# Patient Record
Sex: Female | Born: 1975 | Race: Black or African American | Hispanic: No | Marital: Single | Smoking: Never smoker
Health system: Southern US, Community
[De-identification: ages and names within clinical notes are randomized; demographics above are authoritative.]

## PROBLEM LIST (undated history)

## (undated) HISTORY — PX: ABDOMINAL HYSTERECTOMY: SHX81

---

## 2015-08-28 ENCOUNTER — Emergency Department (HOSPITAL_COMMUNITY): Payer: BLUE CROSS/BLUE SHIELD

## 2015-08-28 ENCOUNTER — Encounter (HOSPITAL_COMMUNITY): Payer: Self-pay | Admitting: *Deleted

## 2015-08-28 ENCOUNTER — Emergency Department (HOSPITAL_COMMUNITY)
Admission: EM | Admit: 2015-08-28 | Discharge: 2015-08-28 | Disposition: A | Discharge status: Home / Self Care | Payer: BLUE CROSS/BLUE SHIELD | Attending: Emergency Medicine | Admitting: Emergency Medicine

## 2015-08-28 DIAGNOSIS — R110 Nausea: Secondary | ICD-10-CM | POA: Insufficient documentation

## 2015-08-28 DIAGNOSIS — R0789 Other chest pain: Secondary | ICD-10-CM | POA: Diagnosis not present

## 2015-08-28 DIAGNOSIS — R0602 Shortness of breath: Secondary | ICD-10-CM | POA: Insufficient documentation

## 2015-08-28 DIAGNOSIS — R079 Chest pain, unspecified: Secondary | ICD-10-CM | POA: Diagnosis present

## 2015-08-28 LAB — BASIC METABOLIC PANEL
Anion gap: 11 (ref 5–15)
BUN: 11 mg/dL (ref 6–20)
CALCIUM: 9.5 mg/dL (ref 8.9–10.3)
CO2: 25 mmol/L (ref 22–32)
CREATININE: 0.82 mg/dL (ref 0.44–1.00)
Chloride: 105 mmol/L (ref 101–111)
GFR calc Af Amer: >60 mL/min (ref >60)
GFR calc non Af Amer: >60 mL/min (ref >60)
GLUCOSE: 88 mg/dL (ref 65–99)
Potassium: 3.8 mmol/L (ref 3.5–5.1)
Sodium: 141 mmol/L (ref 135–145)

## 2015-08-28 LAB — CBC
HCT: 36.1 % (ref 36.0–46.0)
HEMOGLOBIN: 11.5 g/dL — AB (ref 12.0–15.0)
MCH: 27.4 pg (ref 26.0–34.0)
MCHC: 31.9 g/dL (ref 30.0–36.0)
MCV: 86.2 fL (ref 78.0–100.0)
Platelets: 311 10*3/uL (ref 150–400)
RBC: 4.19 MIL/uL (ref 3.87–5.11)
RDW: 14.2 % (ref 11.5–15.5)
WBC: 6.6 10*3/uL (ref 4.0–10.5)

## 2015-08-28 LAB — I-STAT TROPONIN, ED: TROPONIN I, POC: 0 ng/mL (ref 0.00–0.08)

## 2015-08-28 MED ORDER — NAPROXEN 500 MG PO TABS: 500.0000 mg | ORAL_TABLET | Freq: Two times a day (BID) | ORAL | Status: AC

## 2015-08-28 MED ORDER — TRAMADOL HCL 50 MG PO TABS: 50.0000 mg | ORAL_TABLET | Freq: Four times a day (QID) | ORAL | Status: AC | PRN

## 2015-08-28 MED ORDER — IOPAMIDOL (ISOVUE-370) INJECTION 76%
INTRAVENOUS | Status: AC
  Administered 2015-08-28: 100 mL
  Filled 2015-08-28: qty 100

## 2015-08-28 MED ORDER — KETOROLAC TROMETHAMINE 30 MG/ML IJ SOLN
30.0000 mg | Freq: Once | INTRAMUSCULAR | Status: AC
  Administered 2015-08-28: 30 mg via INTRAVENOUS
  Filled 2015-08-28: qty 1

## 2015-08-28 NOTE — ED Notes (Signed)
Attempted IV x1.

## 2015-08-28 NOTE — ED Notes (Addendum)
Pt reports mid chest pains since Saturday. Pain increases with movement and palpations. Now also having sob, reports n/v for past two days. ekg done at triage and no acute distress noted

## 2015-08-28 NOTE — ED Notes (Addendum)
IV attempted x2

## 2015-08-28 NOTE — Discharge Instructions (Signed)
Take naproxen as directed for the next 2 days, then as needed for pain. Use Ultram only as needed for breakthrough pain. Return to ER for new or worsening symptoms, any additional concerns.

## 2015-08-28 NOTE — ED Provider Notes (Signed)
CSN: [id number]     Arrival date & time 08/28/15  1345 History   First MD Initiated Contact with Patient 08/28/15 1605     Chief Complaint  Patient presents with  . Chest Pain  . Shortness of Breath    (Consider location/radiation/quality/duration/timing/severity/associated sxs/prior Treatment) Patient is a 40 y.o. female presenting with chest pain and shortness of breath. The history is provided by the patient and medical records. No language interpreter was used.  Chest Pain Associated symptoms: nausea and shortness of breath   Associated symptoms: no abdominal pain, no cough, no dizziness, no fever, no headache, no palpitations, not vomiting and no weakness   Shortness of Breath Associated symptoms: chest pain   Associated symptoms: no abdominal pain, no cough, no fever, no headaches, no rash, no sore throat, no vomiting and no wheezing    Morgan Pennington is a 40 y.o. female  who presents to the Emergency Department complaining of sharp, central chest pain that began Saturday, but acutely worsened today. Patient is from [place] and drove down to [place] with fiance to visit family in the area on Saturday when symptoms began. Patient states chest pain is worse with palpation and movement. The first 2-3 days, pain was intermittent, however now pain is constant and worse with exertion. Associated symptoms include nausea and shortness of breath which began today. Ibuprofen taken with little relief. Patient had bronchitis approximately two weeks ago, stating she believes symptoms have improved. Of note, patient is on hormone replacement therapy.   Cardiac risk factors: Denies HTN, HLD, heart disease, not a smoker, no cardiac family history.   History reviewed. No pertinent past medical history. Past Surgical History  Procedure Laterality Date  . Abdominal hysterectomy     History reviewed. No pertinent family history. Social History  Substance Use Topics  . Smoking status: Never Smoker    . Smokeless tobacco: None  . Alcohol Use: Yes     Comment: occ   OB History    No data available     Review of Systems  Constitutional: Negative for fever and chills.  HENT: Negative for congestion and sore throat.   Eyes: Negative for visual disturbance.  Respiratory: Positive for shortness of breath. Negative for cough and wheezing.   Cardiovascular: Positive for chest pain. Negative for palpitations and leg swelling.  Gastrointestinal: Positive for nausea. Negative for vomiting and abdominal pain.  Genitourinary: Negative for dysuria.  Musculoskeletal: Negative for myalgias and arthralgias.  Skin: Negative for rash.  Neurological: Negative for dizziness, weakness and headaches.      Allergies  Dilaudid and Percocet  Home Medications   Prior to Admission medications   Medication Sig Start Date End Date Taking? Authorizing Provider  naproxen (NAPROSYN) 500 MG tablet Take 1 tablet (500 mg total) by mouth 2 (two) times daily. 08/28/15   Chase Picket Ward, PA-C  traMADol (ULTRAM) 50 MG tablet Take 1 tablet (50 mg total) by mouth every 6 (six) hours as needed. 08/28/15   Jaime Pilcher Ward, PA-C   BP 118/60 mmHg  Pulse 55  Temp(Src) 98.2 F (36.8 C) (Oral)  Resp 16  SpO2 100% Physical Exam  Constitutional: She is oriented to person, place, and time. She appears well-developed and well-nourished.  Appears anxious, NAD.  HENT:  Head: Normocephalic and atraumatic.  Mouth/Throat: Oropharynx is clear and moist.  Airway patent.   Cardiovascular: Normal rate, regular rhythm, normal heart sounds and intact distal pulses.  Exam reveals no gallop and no friction rub.  No murmur heard. Pulmonary/Chest: Breath sounds normal. No respiratory distress. She has no wheezes. She has no rales.    100% O2 on RA. Difficulty taking deep breaths.   Abdominal: Soft. Bowel sounds are normal. She exhibits no distension and no mass. There is no tenderness. There is no rebound and no  guarding.  Musculoskeletal: She exhibits no edema.  No calf tenderness, negative homan's bilaterally.   Neurological: She is alert and oriented to person, place, and time.  Skin: Skin is warm and dry.  Nursing note and vitals reviewed.   ED Course  Procedures (including critical care time) Labs Review Labs Reviewed  CBC - Abnormal; Notable for the following:    Hemoglobin 11.5 (*)    All other components within normal limits  BASIC METABOLIC PANEL  I-STAT TROPOININ, ED    Imaging Review Dg Chest 2 View  08/28/2015  CLINICAL DATA:  Acute chest pain and shortness of breath. EXAM: CHEST  2 VIEW COMPARISON:  None. FINDINGS: The heart size and mediastinal contours are within normal limits. Both lungs are clear. No pneumothorax or pleural effusion is noted. The visualized skeletal structures are unremarkable. IMPRESSION: No active cardiopulmonary disease. Electronically Signed   By: Lupita Raider, M.D.   On: 08/28/2015 14:30   Ct Angio Chest Pe W/cm &/or Wo Cm  08/28/2015  CLINICAL DATA:  Acute onset of mid chest pain and palpitations. Shortness of breath. Nausea and vomiting. Initial encounter. EXAM: CT ANGIOGRAPHY CHEST WITH CONTRAST TECHNIQUE: Multidetector CT imaging of the chest was performed using the standard protocol during bolus administration of intravenous contrast. Multiplanar CT image reconstructions and MIPs were obtained to evaluate the vascular anatomy. CONTRAST:  80 mL of Isovue 370 IV contrast COMPARISON:  Chest radiograph performed earlier today at 2:04 p.m. FINDINGS: There is no evidence of pulmonary embolus. Minimal bibasilar atelectasis is noted. The lungs are otherwise clear. There is no evidence of significant focal consolidation, pleural effusion or pneumothorax. No masses are identified; no abnormal focal contrast enhancement is seen. The mediastinum is unremarkable appearance. No mediastinal lymphadenopathy is seen. No pericardial effusion is identified. The great  vessels are grossly unremarkable in appearance. No axillary lymphadenopathy is seen. The visualized portions of the thyroid gland are unremarkable in appearance. Bilateral breast implants are grossly unremarkable in appearance. The visualized portions of the liver and spleen are unremarkable. There is mild reflux of contrast into the hepatic veins and IVC. The visualized portions of the pancreas, gallbladder, stomach, adrenal glands and kidneys are within normal limits. No acute osseous abnormalities are seen. Review of the MIP images confirms the above findings. IMPRESSION: 1. No evidence of pulmonary embolus. 2. Minimal bibasilar atelectasis noted.  Lungs otherwise clear. 3. Mild reflux of contrast noted into the hepatic veins and IVC. Electronically Signed   By: Roanna Raider M.D.   On: 08/28/2015 19:01   I have personally reviewed and evaluated these images and lab results as part of my medical decision-making.   EKG Interpretation   Date/Time:  Wednesday August 28 2015 16:27:32 EDT Ventricular Rate:  54 PR Interval:  165 QRS Duration: 83 QT Interval:  453 QTC Calculation: 429 R Axis:   84 Text Interpretation:  Sinus rhythm Low voltage, precordial leads Confirmed  by MESNER MD, Barbara Cower [phone]) on 08/28/2015 5:41:36 PM      MDM   Final diagnoses:  Atypical chest pain   Morgan Pennington presents with central chest pain associated with shortness of breath that first started on Saturday,  however acutely worsened today. Patient admits to recent bronchitis/cough 2 weeks ago. On exam, patient with labored breathing and tenderness to palpation of central chest. Afebrile and vital signs are stable. Patient has no cardiac risk factors, however [place] and is on hormone replacement, making her at high risk for PE.  Labs: CBC, BMP, and troponin reviewed and reassuring. Imaging: Chest x-ray with no acute cardiopulmonary disease. CT and she has with no evidence of  PE. EKG reviewed.  Therapeutics: Toradol   7:24 PM - Patient reevaluated after Toradol and feels much improved. Breathing unlabored.   A&P: Workup negative for PE and chest pain unlikely to be of CAD etiology. Likely secondary to costochondritis. Will treat with naproxen and give short course of ultram for pain. PCP follow up encouraged. Return precautions given. Patient agrees with plan and all questions were answered.   Patient seen by and discussed with Dr. Clayborne DanaMesner who agrees with treatment plan.     Rand Surgical Pavilion CorpJaime Pilcher Ward, PA-C 08/28/15 1954  Marily MemosJason Mesner, MD 08/31/15 1240

## 2015-08-28 NOTE — ED Notes (Signed)
MD at bedside. 

## 2017-03-26 IMAGING — CT CT ANGIO CHEST
2 of 7 series · 18 of 36 positions shown · IV contrast (Omni 300)
Comparison: Chest radiograph performed earlier today at [DATE] p.m.

CLINICAL DATA: Acute onset of mid chest pain and palpitations.
Shortness of breath. Nausea and vomiting. Initial encounter.

EXAM:
CT ANGIOGRAPHY CHEST WITH CONTRAST
TECHNIQUE: Multidetector CT imaging of the chest was performed using the
standard protocol during bolus administration of intravenous
contrast. Multiplanar CT image reconstructions and MIPs were
obtained to evaluate the vascular anatomy.
CONTRAST:  80 mL of Isovue 370 IV contrast

[Series 6: pe thins · axial · 0.62mm/px · z∈[+1446,+1707]mm · 17 of 589 slices shown]
[im 33/589  lung]
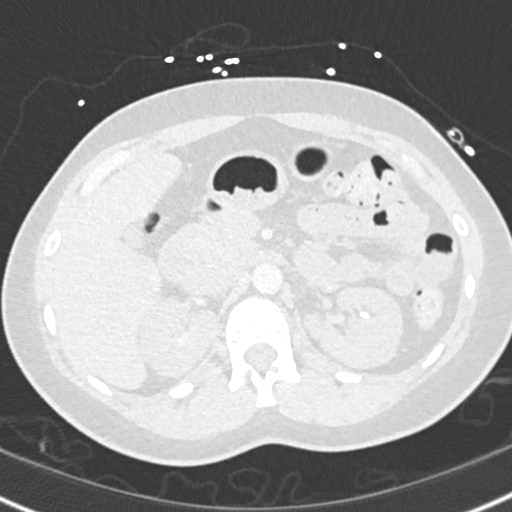
[im 66/589  mediastinal]
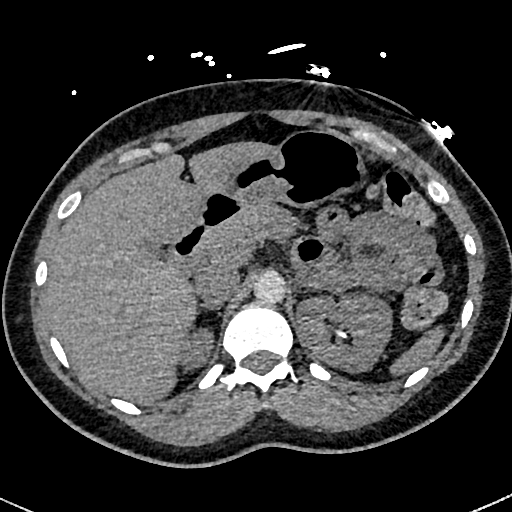
[im 99/589  lung]
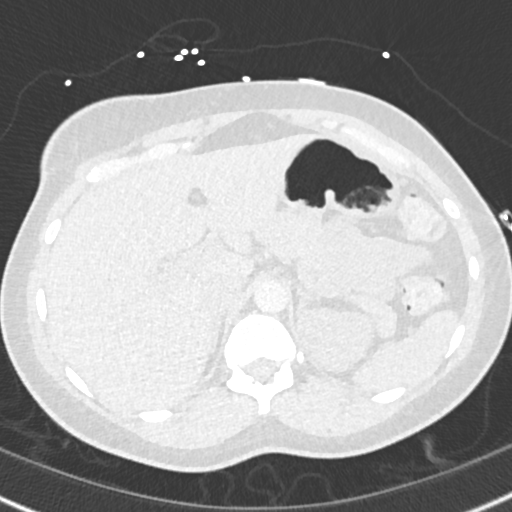
[im 131/589  mediastinal]
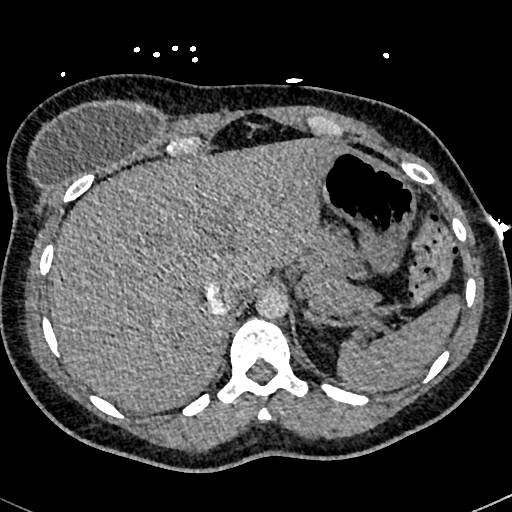
[im 164/589  lung]
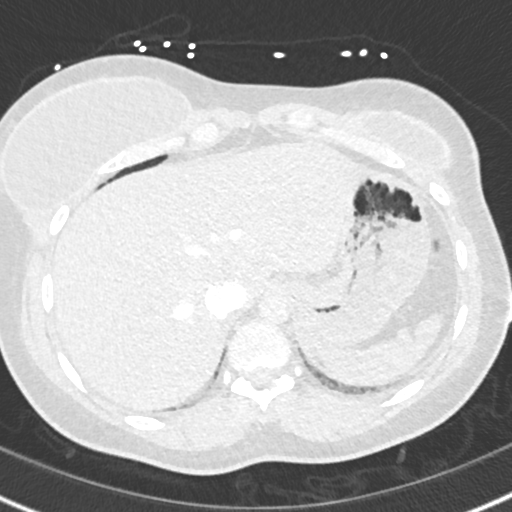
[im 197/589  mediastinal]
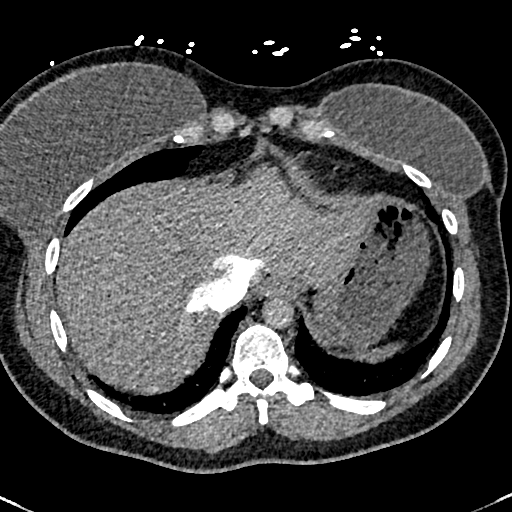
[im 229/589  lung]
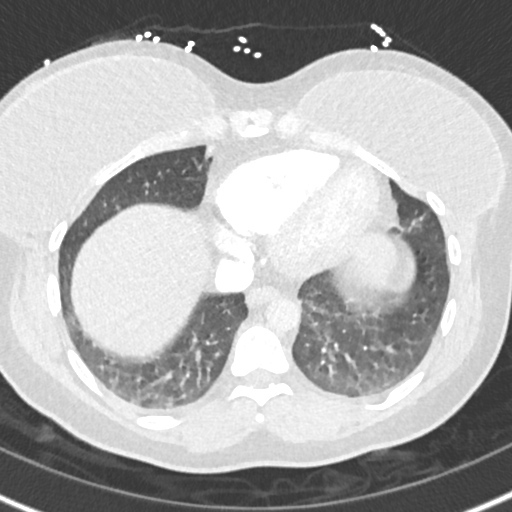
[im 262/589  mediastinal]
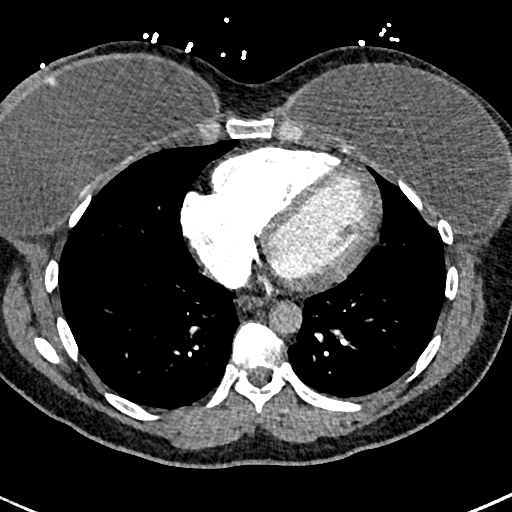
[im 295/589  lung]
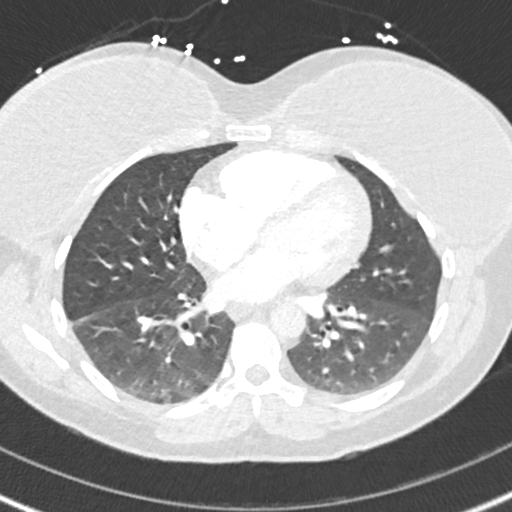
[im 327/589  mediastinal]
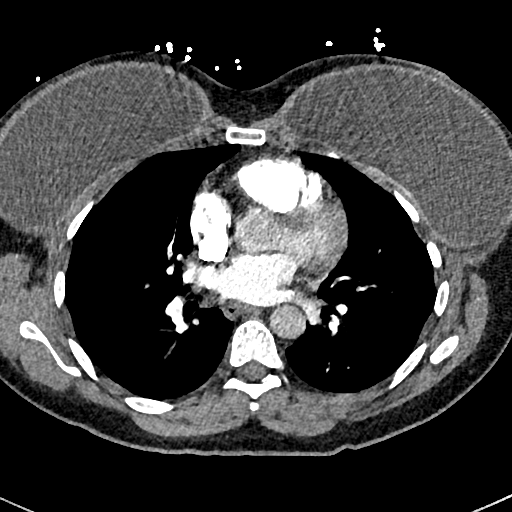
[im 360/589  lung]
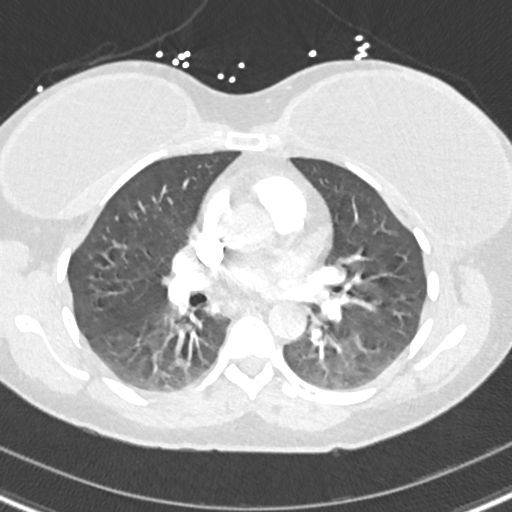
[im 393/589  mediastinal]
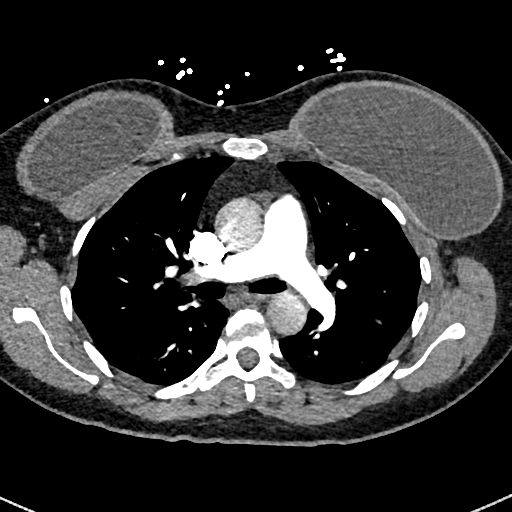
[im 425/589  lung]
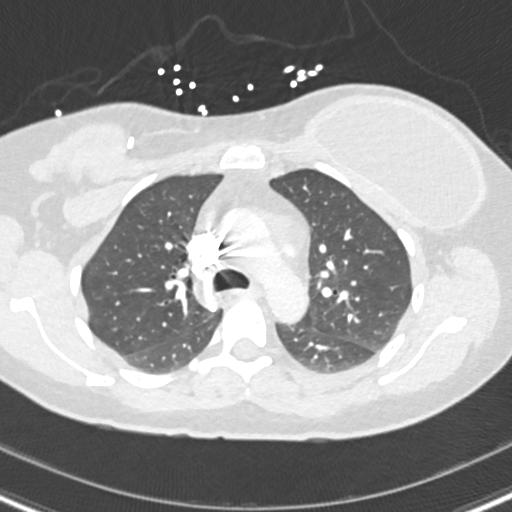
[im 458/589  mediastinal]
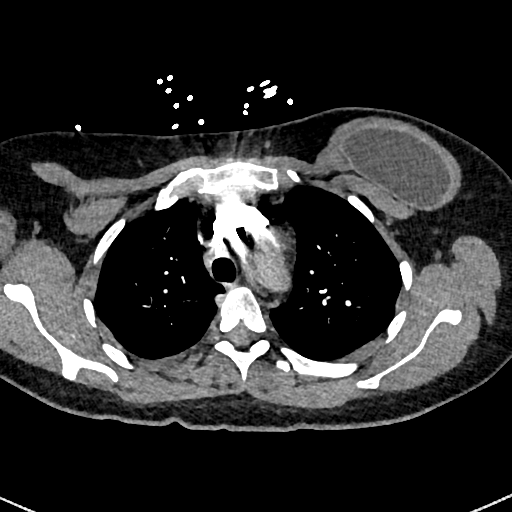
[im 490/589  lung]
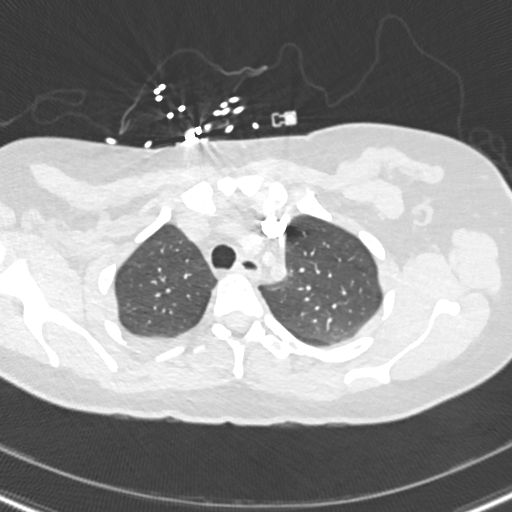
[im 523/589  mediastinal]
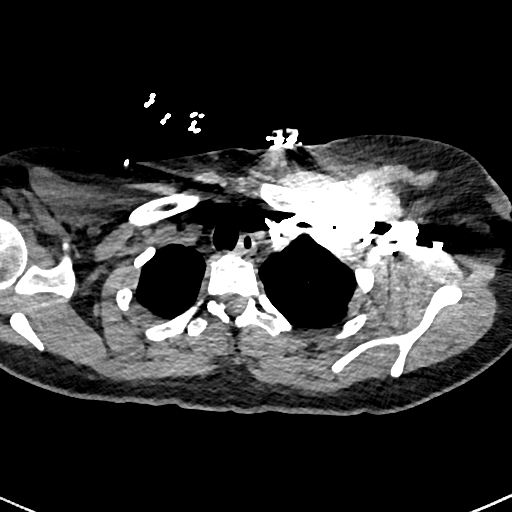
[im 556/589  lung]
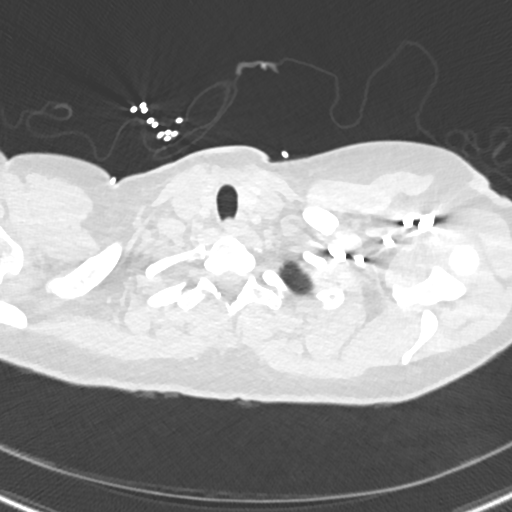

[Series 7: pe 2mm cor · coronal · 0.59mm/px · 1 of 113 slices shown]
[im 57/113  mediastinal]
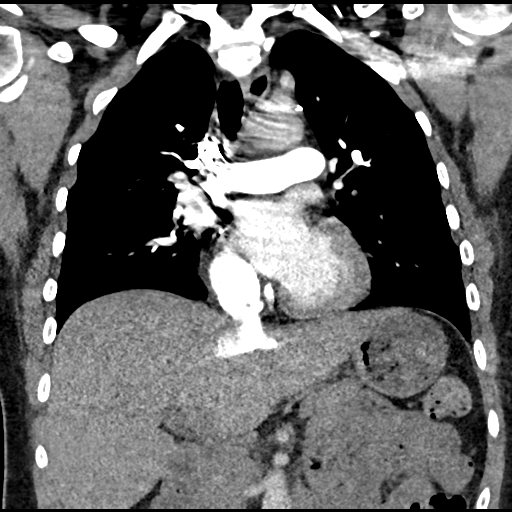

[18 of 36 positions shown; findings below may reference images not displayed]

FINDINGS: There is no evidence of pulmonary embolus.

Minimal bibasilar atelectasis is noted. The lungs are otherwise
clear. There is no evidence of significant focal consolidation,
pleural effusion or pneumothorax. No masses are identified; no
abnormal focal contrast enhancement is seen.

The mediastinum is unremarkable appearance. No mediastinal
lymphadenopathy is seen. No pericardial effusion is identified. The
great vessels are grossly unremarkable in appearance. No axillary
lymphadenopathy is seen. The visualized portions of the thyroid
gland are unremarkable in appearance.

Bilateral breast implants are grossly unremarkable in appearance.

The visualized portions of the liver and spleen are unremarkable.
There is mild reflux of contrast into the hepatic veins and IVC. The
visualized portions of the pancreas, gallbladder, stomach, adrenal
glands and kidneys are within normal limits.

No acute osseous abnormalities are seen.

Review of the MIP images confirms the above findings.
IMPRESSION: 1. No evidence of pulmonary embolus.
2. Minimal bibasilar atelectasis noted.  Lungs otherwise clear.
3. Mild reflux of contrast noted into the hepatic veins and IVC.

## 2017-03-26 IMAGING — DX DG CHEST 2V
2 series · 2 of 2 positions shown · non-contrast
Comparison: None.

CLINICAL DATA: Acute chest pain and shortness of breath.

EXAM:
CHEST  2 VIEW

[chest pa]
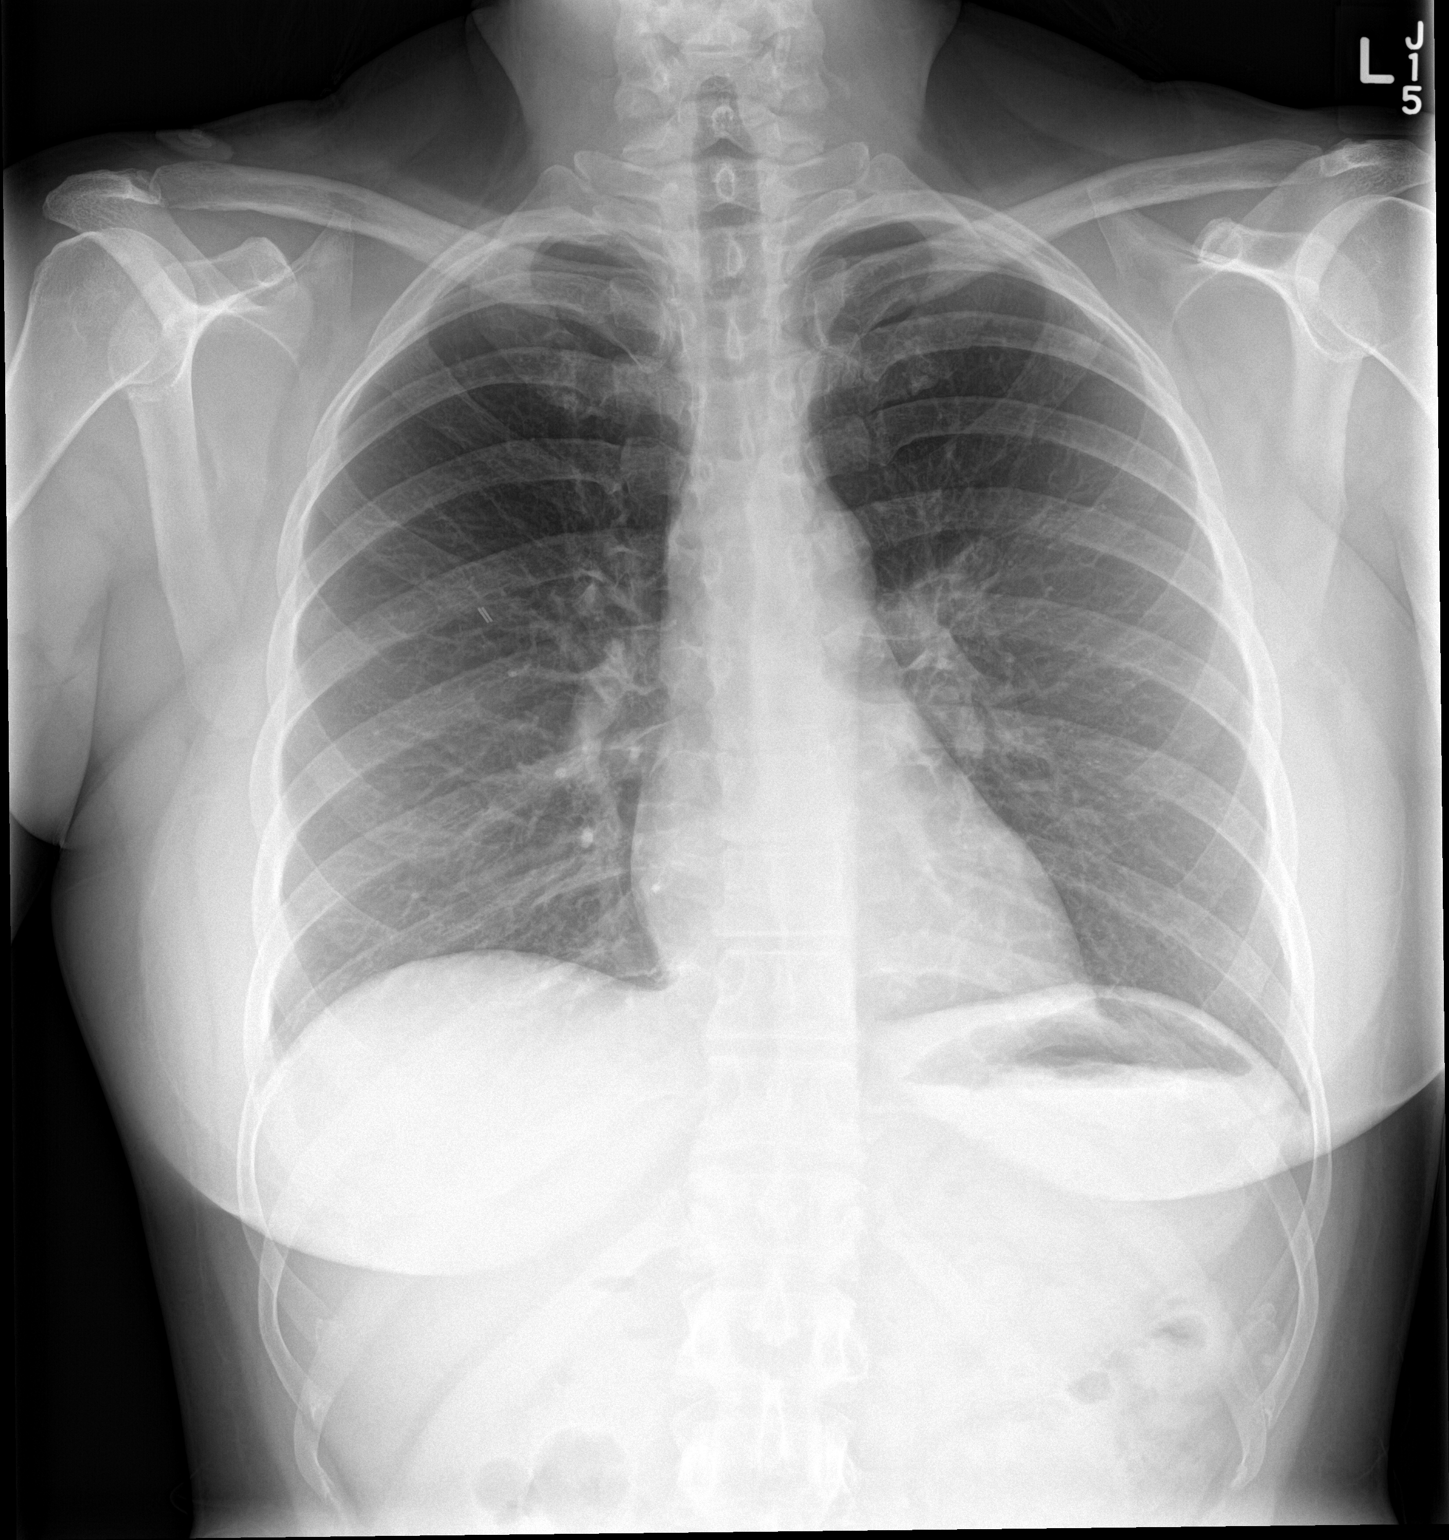

[chest lat]
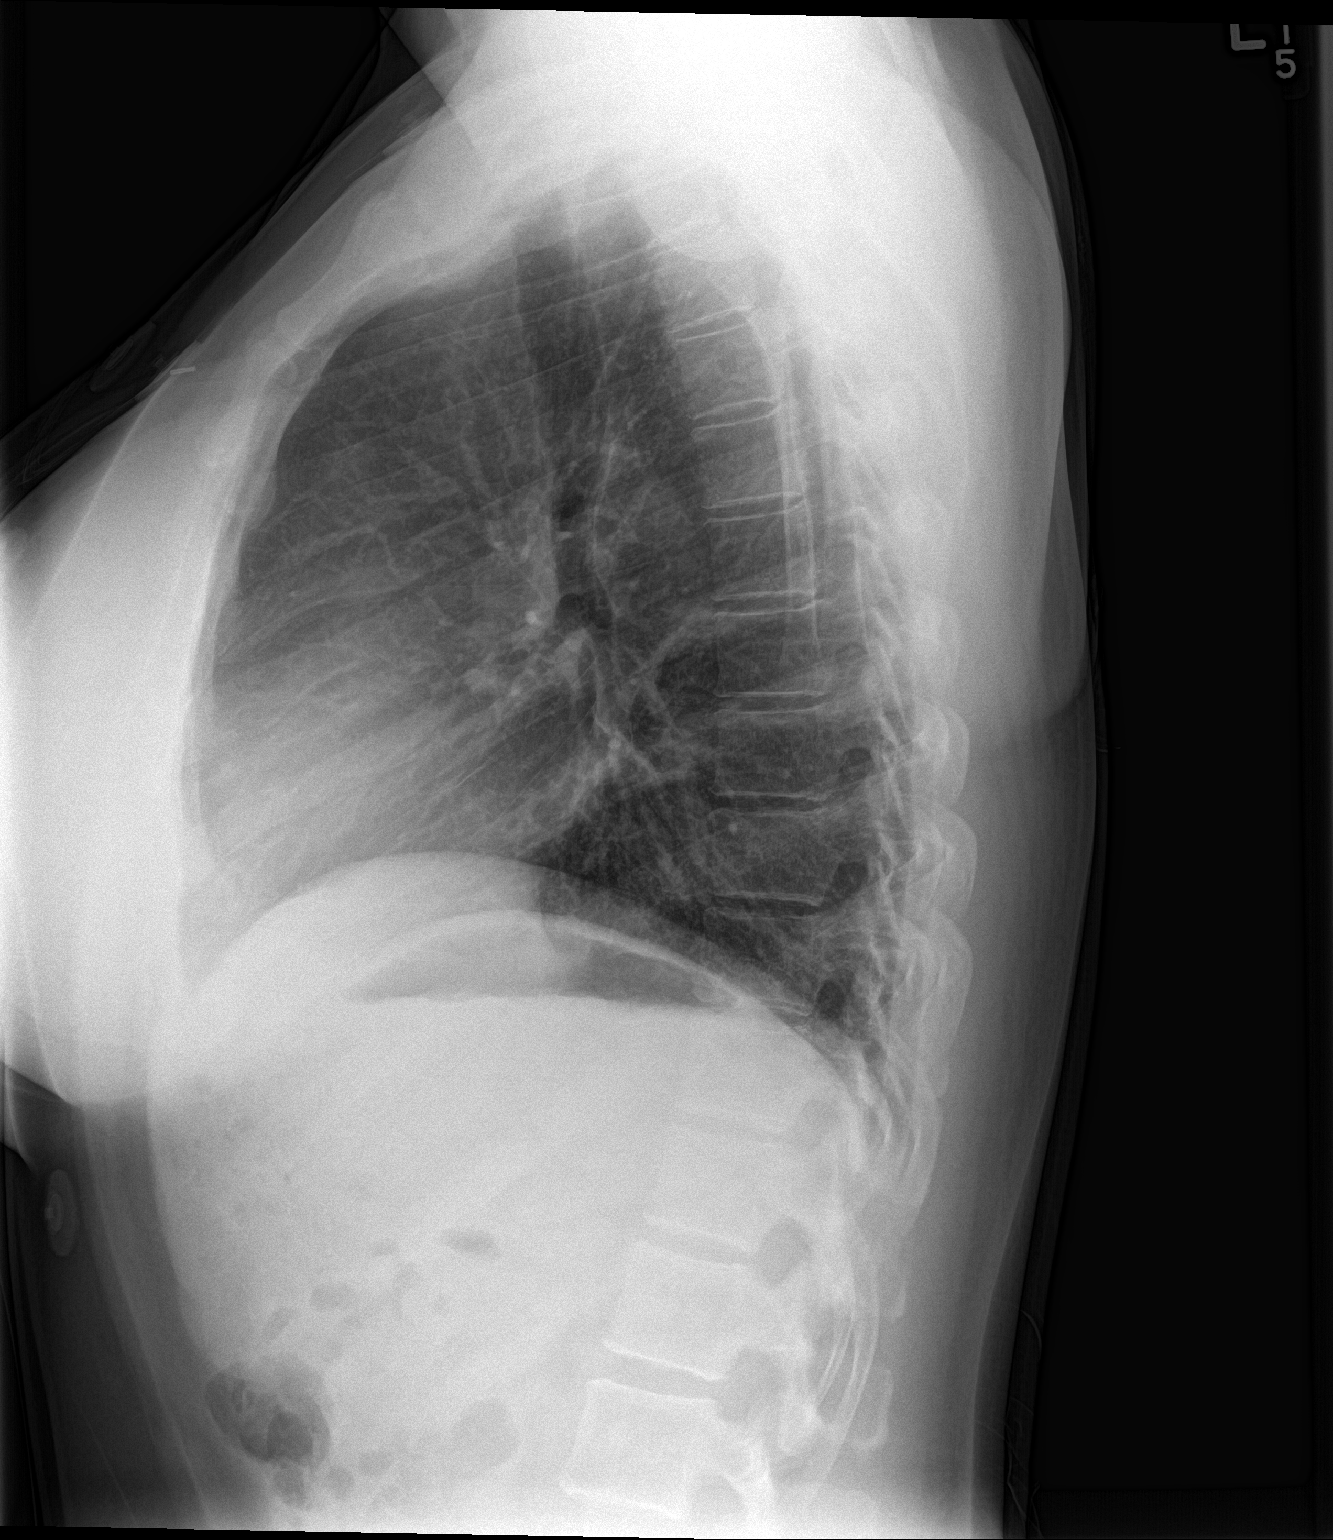

[2 of 2 positions shown; findings below may reference images not displayed]

FINDINGS: The heart size and mediastinal contours are within normal limits.
Both lungs are clear. No pneumothorax or pleural effusion is noted.
The visualized skeletal structures are unremarkable.
IMPRESSION: No active cardiopulmonary disease.
